# Patient Record
Sex: Male | Born: 1970 | Race: Black or African American | Hispanic: No | Marital: Married | Smoking: Former smoker
Health system: Southern US, Community
[De-identification: ages and names within clinical notes are randomized; demographics above are authoritative.]

## PROBLEM LIST (undated history)

## (undated) DIAGNOSIS — W3400XA Accidental discharge from unspecified firearms or gun, initial encounter: Secondary | ICD-10-CM

## (undated) DIAGNOSIS — K219 Gastro-esophageal reflux disease without esophagitis: Secondary | ICD-10-CM

## (undated) DIAGNOSIS — J45909 Unspecified asthma, uncomplicated: Secondary | ICD-10-CM

## (undated) HISTORY — PX: NEPHRECTOMY: SHX65

---

## 2013-04-09 ENCOUNTER — Emergency Department (HOSPITAL_COMMUNITY): Payer: Medicaid Other

## 2013-04-09 ENCOUNTER — Encounter (HOSPITAL_COMMUNITY): Payer: Self-pay | Admitting: *Deleted

## 2013-04-09 ENCOUNTER — Emergency Department (HOSPITAL_COMMUNITY)
Admission: EM | Admit: 2013-04-09 | Discharge: 2013-04-09 | Disposition: A | Discharge status: Home / Self Care | Payer: Medicaid Other | Attending: Emergency Medicine | Admitting: Emergency Medicine

## 2013-04-09 DIAGNOSIS — Z87891 Personal history of nicotine dependence: Secondary | ICD-10-CM | POA: Insufficient documentation

## 2013-04-09 DIAGNOSIS — N390 Urinary tract infection, site not specified: Secondary | ICD-10-CM | POA: Insufficient documentation

## 2013-04-09 DIAGNOSIS — R109 Unspecified abdominal pain: Secondary | ICD-10-CM | POA: Insufficient documentation

## 2013-04-09 DIAGNOSIS — R05 Cough: Secondary | ICD-10-CM | POA: Insufficient documentation

## 2013-04-09 DIAGNOSIS — Z79899 Other long term (current) drug therapy: Secondary | ICD-10-CM | POA: Insufficient documentation

## 2013-04-09 DIAGNOSIS — R1310 Dysphagia, unspecified: Secondary | ICD-10-CM | POA: Insufficient documentation

## 2013-04-09 DIAGNOSIS — M542 Cervicalgia: Secondary | ICD-10-CM | POA: Insufficient documentation

## 2013-04-09 DIAGNOSIS — R112 Nausea with vomiting, unspecified: Secondary | ICD-10-CM | POA: Insufficient documentation

## 2013-04-09 DIAGNOSIS — R51 Headache: Secondary | ICD-10-CM | POA: Insufficient documentation

## 2013-04-09 DIAGNOSIS — J3489 Other specified disorders of nose and nasal sinuses: Secondary | ICD-10-CM | POA: Insufficient documentation

## 2013-04-09 DIAGNOSIS — R6883 Chills (without fever): Secondary | ICD-10-CM | POA: Insufficient documentation

## 2013-04-09 DIAGNOSIS — J029 Acute pharyngitis, unspecified: Secondary | ICD-10-CM | POA: Insufficient documentation

## 2013-04-09 DIAGNOSIS — J36 Peritonsillar abscess: Secondary | ICD-10-CM | POA: Insufficient documentation

## 2013-04-09 DIAGNOSIS — Z87828 Personal history of other (healed) physical injury and trauma: Secondary | ICD-10-CM | POA: Insufficient documentation

## 2013-04-09 DIAGNOSIS — R059 Cough, unspecified: Secondary | ICD-10-CM | POA: Insufficient documentation

## 2013-04-09 DIAGNOSIS — K219 Gastro-esophageal reflux disease without esophagitis: Secondary | ICD-10-CM | POA: Insufficient documentation

## 2013-04-09 HISTORY — DX: Gastro-esophageal reflux disease without esophagitis: K21.9

## 2013-04-09 HISTORY — DX: Accidental discharge from unspecified firearms or gun, initial encounter: W34.00XA

## 2013-04-09 LAB — DIFFERENTIAL
Basophils Absolute: 0 10*3/uL (ref 0.0–0.1)
Eosinophils Absolute: 0.1 10*3/uL (ref 0.0–0.7)
Eosinophils Relative: 0 % (ref 0–5)
Lymphocytes Relative: 11 % — ABNORMAL LOW (ref 12–46)
Monocytes Absolute: 2.1 10*3/uL — ABNORMAL HIGH (ref 0.1–1.0)

## 2013-04-09 LAB — CBC
HCT: 39 % (ref 39.0–52.0)
MCHC: 36.7 g/dL — ABNORMAL HIGH (ref 30.0–36.0)
RDW: 13.7 % (ref 11.5–15.5)
WBC: 18.9 10*3/uL — ABNORMAL HIGH (ref 4.0–10.5)

## 2013-04-09 LAB — URINALYSIS, ROUTINE W REFLEX MICROSCOPIC
Nitrite: NEGATIVE
Protein, ur: 30 mg/dL — AB
Specific Gravity, Urine: 1.025 (ref 1.005–1.030)
Urobilinogen, UA: 0.2 mg/dL (ref 0.0–1.0)

## 2013-04-09 LAB — BASIC METABOLIC PANEL
BUN: 19 mg/dL (ref 6–23)
Chloride: 100 mEq/L (ref 96–112)
GFR calc Af Amer: >90 mL/min (ref >90)
GFR calc non Af Amer: 80 mL/min — ABNORMAL LOW (ref >90)
Potassium: 3.9 mEq/L (ref 3.5–5.1)
Sodium: 139 mEq/L (ref 135–145)

## 2013-04-09 LAB — POCT I-STAT TROPONIN I: Troponin i, poc: 0 ng/mL (ref 0.00–0.08)

## 2013-04-09 LAB — URINE MICROSCOPIC-ADD ON

## 2013-04-09 LAB — PRO B NATRIURETIC PEPTIDE: Pro B Natriuretic peptide (BNP): 15 pg/mL (ref 0–125)

## 2013-04-09 MED ORDER — SODIUM CHLORIDE 0.9 % IV BOLUS (SEPSIS)
2000.0000 mL | Freq: Once | INTRAVENOUS | Status: AC
  Administered 2013-04-09: 2000 mL via INTRAVENOUS

## 2013-04-09 MED ORDER — CEPHALEXIN 500 MG PO CAPS: 500.0000 mg | ORAL_CAPSULE | Freq: Two times a day (BID) | ORAL | Status: DC

## 2013-04-09 MED ORDER — DEXAMETHASONE SODIUM PHOSPHATE 10 MG/ML IJ SOLN
20.0000 mg | Freq: Once | INTRAMUSCULAR | Status: AC
  Administered 2013-04-09: 20 mg via INTRAVENOUS
  Filled 2013-04-09: qty 1

## 2013-04-09 MED ORDER — DEXTROSE 5 % IV SOLN
1.0000 g | Freq: Once | INTRAVENOUS | Status: AC
  Administered 2013-04-09: 1 g via INTRAVENOUS
  Filled 2013-04-09: qty 10

## 2013-04-09 MED ORDER — CLINDAMYCIN PHOSPHATE 600 MG/50ML IV SOLN
600.0000 mg | Freq: Once | INTRAVENOUS | Status: DC
  Administered 2013-04-09: 600 mg via INTRAVENOUS
  Filled 2013-04-09: qty 50

## 2013-04-09 MED ORDER — DEXAMETHASONE SODIUM PHOSPHATE 10 MG/ML IJ SOLN
INTRAMUSCULAR | Status: AC
  Administered 2013-04-09: 10 mg
  Filled 2013-04-09: qty 1

## 2013-04-09 MED ORDER — IBUPROFEN 800 MG PO TABS: 800.0000 mg | ORAL_TABLET | Freq: Three times a day (TID) | ORAL | Status: DC

## 2013-04-09 MED ORDER — CLINDAMYCIN PHOSPHATE 900 MG/50ML IV SOLN
900.0000 mg | Freq: Once | INTRAVENOUS | Status: AC
  Administered 2013-04-09: 900 mg via INTRAVENOUS
  Filled 2013-04-09: qty 50

## 2013-04-09 MED ORDER — ACETAMINOPHEN 325 MG PO TABS
650.0000 mg | ORAL_TABLET | Freq: Once | ORAL | Status: AC
  Administered 2013-04-09: 650 mg via ORAL
  Filled 2013-04-09: qty 2

## 2013-04-09 MED ORDER — HYDROCODONE-ACETAMINOPHEN 5-325 MG PO TABS
1.0000 | ORAL_TABLET | Freq: Four times a day (QID) | ORAL | Status: DC | PRN
Start: 2013-04-09 — End: 2013-07-05

## 2013-04-09 MED ORDER — CLINDAMYCIN HCL 150 MG PO CAPS: 300.0000 mg | ORAL_CAPSULE | Freq: Four times a day (QID) | ORAL | Status: DC

## 2013-04-09 NOTE — ED Notes (Signed)
Pt has fever 100.2f. Tylenol given. Pt temp will be checked in prior to discharge.

## 2013-04-09 NOTE — ED Notes (Signed)
Pt reports having mid chest pain since yesterday, having productive cough with yellow sputum. States that cp increases with cough and having mild sob. Airway intact, no resp distress noted. Pt reports having foul smelling urine also.

## 2013-04-09 NOTE — ED Notes (Signed)
Lab called to add differential to previously collected blood specimen.
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
## 2013-04-10 NOTE — ED Provider Notes (Signed)
Medical screening examination/treatment/procedure(s) were performed by non-physician practitioner and as supervising physician I was immediately available for consultation/collaboration.   Lailyn Appelbaum L Mareon Robinette, MD 04/10/13 2330 

## 2013-04-15 LAB — URINE CULTURE: Colony Count: >=100000

## 2013-07-05 ENCOUNTER — Emergency Department (HOSPITAL_COMMUNITY)
Admission: EM | Admit: 2013-07-05 | Discharge: 2013-07-05 | Disposition: A | Discharge status: Home / Self Care | Payer: Medicaid Other | Attending: Emergency Medicine | Admitting: Emergency Medicine

## 2013-07-05 ENCOUNTER — Encounter (HOSPITAL_COMMUNITY): Payer: Self-pay | Admitting: Emergency Medicine

## 2013-07-05 DIAGNOSIS — R112 Nausea with vomiting, unspecified: Secondary | ICD-10-CM | POA: Insufficient documentation

## 2013-07-05 DIAGNOSIS — R509 Fever, unspecified: Secondary | ICD-10-CM | POA: Insufficient documentation

## 2013-07-05 DIAGNOSIS — J029 Acute pharyngitis, unspecified: Secondary | ICD-10-CM

## 2013-07-05 DIAGNOSIS — Z8719 Personal history of other diseases of the digestive system: Secondary | ICD-10-CM | POA: Insufficient documentation

## 2013-07-05 DIAGNOSIS — J45909 Unspecified asthma, uncomplicated: Secondary | ICD-10-CM | POA: Insufficient documentation

## 2013-07-05 DIAGNOSIS — Z87828 Personal history of other (healed) physical injury and trauma: Secondary | ICD-10-CM | POA: Insufficient documentation

## 2013-07-05 HISTORY — DX: Unspecified asthma, uncomplicated: J45.909

## 2013-07-05 LAB — RAPID STREP SCREEN (MED CTR MEBANE ONLY): Streptococcus, Group A Screen (Direct): NEGATIVE

## 2013-07-05 MED ORDER — ONDANSETRON HCL 4 MG PO TABS: 4.0000 mg | ORAL_TABLET | Freq: Four times a day (QID) | ORAL | Status: DC

## 2013-07-05 MED ORDER — IBUPROFEN 800 MG PO TABS
800.0000 mg | ORAL_TABLET | Freq: Once | ORAL | Status: AC
  Administered 2013-07-05: 800 mg via ORAL
  Filled 2013-07-05: qty 1

## 2013-07-05 MED ORDER — ONDANSETRON 4 MG PO TBDP
8.0000 mg | ORAL_TABLET | Freq: Once | ORAL | Status: AC
  Administered 2013-07-05: 8 mg via ORAL
  Filled 2013-07-05: qty 2
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
## 2013-07-05 NOTE — ED Notes (Signed)
Pt family: [phone] wanted to be called if pt would be admitted and would like to know the room number.

## 2013-07-05 NOTE — ED Notes (Addendum)
Pt reports left side tonsil swelling noted yesterday. Pt reports in the past had swelling to the right tonsil area that was suppose to be drained. Pt reports fever but did not check his temperature at home. Pt unable to swallow. Pt vomiting in triage, reports started yesterday.

## 2013-07-07 LAB — CULTURE, GROUP A STREP

## 2013-07-07 NOTE — ED Provider Notes (Signed)
Medical screening examination/treatment/procedure(s) were performed by non-physician practitioner and as supervising physician I was immediately available for consultation/collaboration.   Gavin Pound. Vernis Eid, MD 07/07/13 2317

## 2015-11-01 ENCOUNTER — Emergency Department (HOSPITAL_COMMUNITY): Payer: Medicaid Other

## 2015-11-01 ENCOUNTER — Encounter (HOSPITAL_COMMUNITY): Payer: Self-pay

## 2015-11-01 ENCOUNTER — Emergency Department (HOSPITAL_COMMUNITY)
Admission: EM | Admit: 2015-11-01 | Discharge: 2015-11-01 | Disposition: A | Discharge status: Home / Self Care | Payer: Medicaid Other | Attending: Emergency Medicine | Admitting: Emergency Medicine

## 2015-11-01 DIAGNOSIS — Z87828 Personal history of other (healed) physical injury and trauma: Secondary | ICD-10-CM | POA: Insufficient documentation

## 2015-11-01 DIAGNOSIS — K219 Gastro-esophageal reflux disease without esophagitis: Secondary | ICD-10-CM | POA: Insufficient documentation

## 2015-11-01 DIAGNOSIS — Z79899 Other long term (current) drug therapy: Secondary | ICD-10-CM | POA: Insufficient documentation

## 2015-11-01 DIAGNOSIS — J45909 Unspecified asthma, uncomplicated: Secondary | ICD-10-CM | POA: Insufficient documentation

## 2015-11-01 DIAGNOSIS — Z87891 Personal history of nicotine dependence: Secondary | ICD-10-CM | POA: Diagnosis not present

## 2015-11-01 DIAGNOSIS — R079 Chest pain, unspecified: Secondary | ICD-10-CM | POA: Diagnosis present

## 2015-11-01 DIAGNOSIS — R0789 Other chest pain: Secondary | ICD-10-CM | POA: Insufficient documentation

## 2015-11-01 LAB — I-STAT TROPONIN, ED: TROPONIN I, POC: 0 ng/mL (ref 0.00–0.08)

## 2015-11-01 LAB — BASIC METABOLIC PANEL
ANION GAP: 7 (ref 5–15)
BUN: 11 mg/dL (ref 6–20)
CALCIUM: 9.1 mg/dL (ref 8.9–10.3)
CO2: 28 mmol/L (ref 22–32)
CREATININE: 1.12 mg/dL (ref 0.61–1.24)
Chloride: 105 mmol/L (ref 101–111)
Glucose, Bld: 98 mg/dL (ref 65–99)
Potassium: 3.8 mmol/L (ref 3.5–5.1)
Sodium: 140 mmol/L (ref 135–145)

## 2015-11-01 LAB — CBC
HCT: 34.8 % — ABNORMAL LOW (ref 39.0–52.0)
HEMOGLOBIN: 11.7 g/dL — AB (ref 13.0–17.0)
MCH: 30.6 pg (ref 26.0–34.0)
MCHC: 33.6 g/dL (ref 30.0–36.0)
MCV: 91.1 fL (ref 78.0–100.0)
PLATELETS: 90 10*3/uL — AB (ref 150–400)
RBC: 3.82 MIL/uL — AB (ref 4.22–5.81)
RDW: 14.5 % (ref 11.5–15.5)
WBC: 7.4 10*3/uL (ref 4.0–10.5)

## 2015-11-01 MED ORDER — KETOROLAC TROMETHAMINE 60 MG/2ML IM SOLN
60.0000 mg | Freq: Once | INTRAMUSCULAR | Status: AC
  Administered 2015-11-01: 60 mg via INTRAMUSCULAR
  Filled 2015-11-01: qty 2

## 2015-11-01 MED ORDER — NAPROXEN 500 MG PO TABS: 500.0000 mg | ORAL_TABLET | Freq: Two times a day (BID) | ORAL | Status: DC

## 2015-11-01 NOTE — ED Notes (Signed)
Pt states that he started having chest pressure Thursday afternoon, no radiation, denies other cardiac symptoms. 8/10 pain

## 2015-11-01 NOTE — Discharge Instructions (Signed)
You were seen today for chest pain. Your workup is reassuring. You may have a muscle or chest wall strain. You are relatively low risk for heart disease. If you have continued pain, you should follow-up with cardiology. If you have any new or worsening symptoms you should be reevaluated immediately.  Chest Wall Pain Chest wall pain is pain in or around the bones and muscles of your chest. Sometimes, an injury causes this pain. Sometimes, the cause may not be known. This pain may take several weeks or longer to get better. HOME CARE INSTRUCTIONS  Pay attention to any changes in your symptoms. Take these actions to help with your pain:   Rest as told by your health care provider.   Avoid activities that cause pain. These include any activities that use your chest muscles or your abdominal and side muscles to lift heavy items.   If directed, apply ice to the painful area:  Put ice in a plastic bag.  Place a towel between your skin and the bag.  Leave the ice on for 20 minutes, 2-3 times per day.  Take over-the-counter and prescription medicines only as told by your health care provider.  Do not use tobacco products, including cigarettes, chewing tobacco, and e-cigarettes. If you need help quitting, ask your health care provider.  Keep all follow-up visits as told by your health care provider. This is important. SEEK MEDICAL CARE IF:  You have a fever.  Your chest pain becomes worse.  You have new symptoms. SEEK IMMEDIATE MEDICAL CARE IF:  You have nausea or vomiting.  You feel sweaty or light-headed.  You have a cough with phlegm (sputum) or you cough up blood.  You develop shortness of breath.   This information is not intended to replace advice given to you by your health care provider. Make sure you discuss any questions you have with your health care provider.   Document Released: 11/27/2005 Document Revised: 08/18/2015 Document Reviewed: 02/22/2015 Elsevier  Interactive Patient Education [employer]2016 [employer]
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
## 2015-11-01 NOTE — ED Notes (Signed)
Family at bedside. 

## 2017-05-11 ENCOUNTER — Encounter (HOSPITAL_COMMUNITY): Payer: Self-pay | Admitting: Family Medicine

## 2017-05-11 ENCOUNTER — Ambulatory Visit (HOSPITAL_COMMUNITY)
Admission: EM | Admit: 2017-05-11 | Discharge: 2017-05-11 | Disposition: A | Discharge status: Home / Self Care | Payer: Medicaid Other | Attending: Internal Medicine | Admitting: Internal Medicine

## 2017-05-11 DIAGNOSIS — R369 Urethral discharge, unspecified: Secondary | ICD-10-CM | POA: Insufficient documentation

## 2017-05-11 DIAGNOSIS — N342 Other urethritis: Secondary | ICD-10-CM

## 2017-05-11 DIAGNOSIS — R1030 Lower abdominal pain, unspecified: Secondary | ICD-10-CM

## 2017-05-11 DIAGNOSIS — K219 Gastro-esophageal reflux disease without esophagitis: Secondary | ICD-10-CM

## 2017-05-11 DIAGNOSIS — N3090 Cystitis, unspecified without hematuria: Secondary | ICD-10-CM | POA: Diagnosis not present

## 2017-05-11 DIAGNOSIS — R112 Nausea with vomiting, unspecified: Secondary | ICD-10-CM

## 2017-05-11 LAB — POCT URINALYSIS DIP (DEVICE)
Glucose, UA: NEGATIVE mg/dL
Ketones, ur: 15 mg/dL — AB
NITRITE: NEGATIVE
Protein, ur: >=300 mg/dL — AB
Specific Gravity, Urine: 1.025 (ref 1.005–1.030)
UROBILINOGEN UA: 0.2 mg/dL (ref 0.0–1.0)
pH: 6 (ref 5.0–8.0)

## 2017-05-11 MED ORDER — CEFTRIAXONE SODIUM 1 G IJ SOLR
INTRAMUSCULAR | Status: AC
  Filled 2017-05-11: qty 10

## 2017-05-11 MED ORDER — AZITHROMYCIN 250 MG PO TABS: ORAL_TABLET | ORAL | 0 refills | Status: AC

## 2017-05-11 MED ORDER — ONDANSETRON HCL 4 MG PO TABS
4.0000 mg | ORAL_TABLET | Freq: Four times a day (QID) | ORAL | 0 refills | Status: AC
Start: 2017-05-11 — End: ?

## 2017-05-11 MED ORDER — LIDOCAINE HCL (PF) 1 % IJ SOLN
INTRAMUSCULAR | Status: AC
  Filled 2017-05-11: qty 2

## 2017-05-11 MED ORDER — CEPHALEXIN 500 MG PO CAPS: 500.0000 mg | ORAL_CAPSULE | Freq: Four times a day (QID) | ORAL | 0 refills | Status: AC

## 2017-05-11 MED ORDER — CEFTRIAXONE SODIUM 1 G IJ SOLR
1.0000 g | Freq: Once | INTRAMUSCULAR | Status: AC
  Administered 2017-05-11: 1 g via INTRAMUSCULAR

## 2017-05-11 NOTE — ED Triage Notes (Signed)
Pt here for lower abd pain and penile discharge.

## 2017-05-11 NOTE — Discharge Instructions (Signed)
Take the Zofran at home. In about 30 minutes take the omeprazole. And another 45 minutes start taking your antibiotics. Take small sips of liquid at a time. Do not eat any large meals, fatty or greasy foods. No fast foods. Take Zantac 150 mg 2 hours before bedtime. Cultures of urine have been obtained and he will be called regarding results especially if any changes need to be made. If you are getting worse, had new symptoms or problems may return, follow up with her primary care doctor or go to emergency department.
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
## 2017-05-12 LAB — URINE CULTURE: Culture: >=100000 — AB

## 2017-05-14 LAB — URINE CYTOLOGY ANCILLARY ONLY
CHLAMYDIA, DNA PROBE: NEGATIVE
NEISSERIA GONORRHEA: NEGATIVE
Trichomonas: NEGATIVE

## 2017-10-19 ENCOUNTER — Encounter (HOSPITAL_COMMUNITY): Payer: Self-pay

## 2017-10-19 ENCOUNTER — Emergency Department (HOSPITAL_COMMUNITY): Payer: Medicaid Other

## 2017-10-19 ENCOUNTER — Other Ambulatory Visit: Payer: Self-pay

## 2017-10-19 ENCOUNTER — Emergency Department (HOSPITAL_COMMUNITY)
Admission: EM | Admit: 2017-10-19 | Discharge: 2017-10-19 | Disposition: A | Discharge status: Home / Self Care | Payer: Medicaid Other | Attending: Emergency Medicine | Admitting: Emergency Medicine

## 2017-10-19 DIAGNOSIS — R070 Pain in throat: Secondary | ICD-10-CM | POA: Diagnosis present

## 2017-10-19 DIAGNOSIS — J45909 Unspecified asthma, uncomplicated: Secondary | ICD-10-CM | POA: Diagnosis not present

## 2017-10-19 DIAGNOSIS — Z79899 Other long term (current) drug therapy: Secondary | ICD-10-CM | POA: Insufficient documentation

## 2017-10-19 DIAGNOSIS — J36 Peritonsillar abscess: Secondary | ICD-10-CM

## 2017-10-19 DIAGNOSIS — R1110 Vomiting, unspecified: Secondary | ICD-10-CM | POA: Diagnosis not present

## 2017-10-19 DIAGNOSIS — M542 Cervicalgia: Secondary | ICD-10-CM | POA: Insufficient documentation

## 2017-10-19 DIAGNOSIS — H9203 Otalgia, bilateral: Secondary | ICD-10-CM | POA: Insufficient documentation

## 2017-10-19 DIAGNOSIS — Z87891 Personal history of nicotine dependence: Secondary | ICD-10-CM | POA: Insufficient documentation

## 2017-10-19 LAB — CBC WITH DIFFERENTIAL/PLATELET
BASOS ABS: 0 10*3/uL (ref 0.0–0.1)
Basophils Relative: 0 %
Eosinophils Absolute: 0 10*3/uL (ref 0.0–0.7)
Eosinophils Relative: 0 %
HEMATOCRIT: 39 % (ref 39.0–52.0)
HEMOGLOBIN: 13.5 g/dL (ref 13.0–17.0)
LYMPHS PCT: 8 %
Lymphs Abs: 1.4 10*3/uL (ref 0.7–4.0)
MCH: 31.4 pg (ref 26.0–34.0)
MCHC: 34.6 g/dL (ref 30.0–36.0)
MCV: 90.7 fL (ref 78.0–100.0)
Monocytes Absolute: 0.9 10*3/uL (ref 0.1–1.0)
Monocytes Relative: 5 %
NEUTROS ABS: 14.7 10*3/uL — AB (ref 1.7–7.7)
NEUTROS PCT: 87 %
PLATELETS: 149 10*3/uL — AB (ref 150–400)
RBC: 4.3 MIL/uL (ref 4.22–5.81)
RDW: 14 % (ref 11.5–15.5)
WBC: 17 10*3/uL — AB (ref 4.0–10.5)

## 2017-10-19 LAB — BASIC METABOLIC PANEL
ANION GAP: 12 (ref 5–15)
BUN: 28 mg/dL — ABNORMAL HIGH (ref 6–20)
CHLORIDE: 103 mmol/L (ref 101–111)
CO2: 23 mmol/L (ref 22–32)
Calcium: 9.5 mg/dL (ref 8.9–10.3)
Creatinine, Ser: 1.22 mg/dL (ref 0.61–1.24)
GFR calc Af Amer: >60 mL/min (ref >60)
Glucose, Bld: 95 mg/dL (ref 65–99)
POTASSIUM: 3.8 mmol/L (ref 3.5–5.1)
SODIUM: 138 mmol/L (ref 135–145)

## 2017-10-19 LAB — RAPID STREP SCREEN (MED CTR MEBANE ONLY): Streptococcus, Group A Screen (Direct): NEGATIVE

## 2017-10-19 MED ORDER — ONDANSETRON HCL 4 MG/2ML IJ SOLN
4.0000 mg | Freq: Once | INTRAMUSCULAR | Status: AC
  Administered 2017-10-19: 4 mg via INTRAVENOUS
  Filled 2017-10-19: qty 2

## 2017-10-19 MED ORDER — DEXAMETHASONE SODIUM PHOSPHATE 10 MG/ML IJ SOLN
10.0000 mg | Freq: Once | INTRAMUSCULAR | Status: AC
  Administered 2017-10-19: 10 mg via INTRAVENOUS
  Filled 2017-10-19: qty 1

## 2017-10-19 MED ORDER — IOPAMIDOL (ISOVUE-300) INJECTION 61%
INTRAVENOUS | Status: AC
  Administered 2017-10-19: 75 mL
  Filled 2017-10-19: qty 75

## 2017-10-19 MED ORDER — CLINDAMYCIN HCL 300 MG PO CAPS: 300.0000 mg | ORAL_CAPSULE | Freq: Four times a day (QID) | ORAL | 0 refills | Status: AC

## 2017-10-19 MED ORDER — CLINDAMYCIN PHOSPHATE 900 MG/50ML IV SOLN
900.0000 mg | Freq: Once | INTRAVENOUS | Status: AC
  Administered 2017-10-19: 900 mg via INTRAVENOUS
  Filled 2017-10-19: qty 50

## 2017-10-19 MED ORDER — KETOROLAC TROMETHAMINE 30 MG/ML IJ SOLN
30.0000 mg | Freq: Once | INTRAMUSCULAR | Status: AC
  Administered 2017-10-19: 30 mg via INTRAVENOUS
  Filled 2017-10-19: qty 1

## 2017-10-19 NOTE — ED Triage Notes (Signed)
Per Pt, Pt is coming from home with complaints of sore throat x 3 days. Reports some chills. UPon assessment, pt has swollen lymph nodes and complains of bilateral ear pain.

## 2017-10-19 NOTE — Discharge Instructions (Signed)
You can take Tylenol or Ibuprofen as directed for pain. You can alternate Tylenol and Ibuprofen every 4 hours. If you take Tylenol at 1pm, then you can take Ibuprofen at 5pm. Then you can take Tylenol again at 9pm.   Take antibiotics as directed. Please take all of your antibiotics until finished.  Follow-up with referred ear nose and throat doctor next week.  Call his office on Monday to arrange for an appointment.  Return the emergency department for any worsening symptoms, worsening pain, fever despite medications, difficulty breathing, inability to swallow your saliva, inability to swallow water, persistent vomiting or any other worsening or concerning symptoms.

## 2017-10-19 NOTE — ED Notes (Signed)
Pt able to speak clearly, reports relief from pain, rates 5/10.
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
## 2017-10-19 NOTE — ED Provider Notes (Signed)
MSE was initiated and I personally evaluated the patient and placed orders (if any) at  5:00 PM on October 19, 2017.  The patient appears stable so that the remainder of the MSE may be completed by another provider.  Care was assumed from Gwendalyn EgeLindsay Layden, PA-C at 5:06 PM.  Briefly, patient presented to the emergency department with a sore throat and CT head and neck revealed a right peritonsillar abscess.  Per discussion with Dr. Christain Sacramentoeo and ear nose and throat, patient is to receive 900 mg of clindamycin and 20 mg of Decadron and be reevaluated.  Patient will receive outpatient therapy and follow-up next week with Dr. Ladona Ridgelaylor as long as he can handle secretions at the end of reassessment.  Patient has been nontoxic-appearing and intermittently handing secretions in the emergency department.  Patient was independently evaluated by Dr. Pricilla LovelessScott Goldston.  Patient was discharged by RN after being able to tolerate a glass of water. Patient d/c to home per discussion with L. Layden, PA-C.      Calvin Warner, Calvin Bost B, PA-C 10/20/17 0154    Calvin Warner, Anthony, MD 10/22/17 [phone]

## 2017-10-19 NOTE — ED Notes (Signed)
Pt given water to drink. 

## 2017-10-21 LAB — CULTURE, GROUP A STREP (THRC)

## 2018-11-19 ENCOUNTER — Ambulatory Visit
Admission: RE | Admit: 2018-11-19 | Discharge: 2018-11-19 | Disposition: A | Discharge status: Home / Self Care | Payer: Medicaid Other | Source: Ambulatory Visit | Attending: Family Medicine | Admitting: Family Medicine

## 2018-11-19 ENCOUNTER — Other Ambulatory Visit: Payer: Self-pay | Admitting: Family Medicine

## 2018-11-19 DIAGNOSIS — M542 Cervicalgia: Secondary | ICD-10-CM

## 2019-02-25 ENCOUNTER — Encounter: Payer: Self-pay | Admitting: Gastroenterology
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
## 2024-06-12 ENCOUNTER — Other Ambulatory Visit: Payer: Self-pay

## 2024-06-12 ENCOUNTER — Emergency Department (HOSPITAL_COMMUNITY)
Admission: EM | Admit: 2024-06-12 | Discharge: 2024-06-12 | Disposition: A | Discharge status: Home / Self Care | Attending: Emergency Medicine | Admitting: Emergency Medicine

## 2024-06-12 ENCOUNTER — Encounter (HOSPITAL_COMMUNITY): Payer: Self-pay | Admitting: *Deleted

## 2024-06-12 DIAGNOSIS — K5900 Constipation, unspecified: Secondary | ICD-10-CM | POA: Insufficient documentation

## 2024-06-12 DIAGNOSIS — N390 Urinary tract infection, site not specified: Secondary | ICD-10-CM | POA: Insufficient documentation

## 2024-06-12 DIAGNOSIS — K6289 Other specified diseases of anus and rectum: Secondary | ICD-10-CM | POA: Diagnosis present

## 2024-06-12 DIAGNOSIS — K644 Residual hemorrhoidal skin tags: Secondary | ICD-10-CM | POA: Diagnosis not present

## 2024-06-12 LAB — URINALYSIS, ROUTINE W REFLEX MICROSCOPIC
Bilirubin Urine: NEGATIVE
Glucose, UA: NEGATIVE mg/dL
Ketones, ur: NEGATIVE mg/dL
Nitrite: NEGATIVE
Protein, ur: NEGATIVE mg/dL
Specific Gravity, Urine: 1.012 (ref 1.005–1.030)
WBC, UA: >50 WBC/hpf (ref 0–5)
pH: 6 (ref 5.0–8.0)

## 2024-06-12 LAB — BASIC METABOLIC PANEL WITH GFR
Anion gap: 9 (ref 5–15)
BUN: 15 mg/dL (ref 6–20)
CO2: 24 mmol/L (ref 22–32)
Calcium: 9 mg/dL (ref 8.9–10.3)
Chloride: 101 mmol/L (ref 98–111)
Creatinine, Ser: 1.18 mg/dL (ref 0.61–1.24)
GFR, Estimated: >60 mL/min (ref >60)
Glucose, Bld: 110 mg/dL — ABNORMAL HIGH (ref 70–99)
Potassium: 4.4 mmol/L (ref 3.5–5.1)
Sodium: 134 mmol/L — ABNORMAL LOW (ref 135–145)

## 2024-06-12 LAB — CBC
HCT: 40.4 % (ref 39.0–52.0)
Hemoglobin: 12.9 g/dL — ABNORMAL LOW (ref 13.0–17.0)
MCH: 29.9 pg (ref 26.0–34.0)
MCHC: 31.9 g/dL (ref 30.0–36.0)
MCV: 93.7 fL (ref 80.0–100.0)
Platelets: 213 10*3/uL (ref 150–400)
RBC: 4.31 MIL/uL (ref 4.22–5.81)
RDW: 14.7 % (ref 11.5–15.5)
WBC: 6.6 10*3/uL (ref 4.0–10.5)
nRBC: 0 % (ref 0.0–0.2)

## 2024-06-12 MED ORDER — CIPROFLOXACIN HCL 500 MG PO TABS: 500.0000 mg | ORAL_TABLET | Freq: Two times a day (BID) | ORAL | 0 refills | Status: AC

## 2024-06-12 MED ORDER — CVS EPSOM SALT GRAN: 1.0000 | GRANULES | Freq: Every day | 0 refills | Status: AC | PRN

## 2024-06-12 MED ORDER — PHENYLEPHRINE-MINERAL OIL-PET 0.25-14-74.9 % RE OINT: 1.0000 | TOPICAL_OINTMENT | Freq: Two times a day (BID) | RECTAL | 0 refills | Status: AC | PRN

## 2024-06-12 MED ORDER — POLYETHYLENE GLYCOL 3350 17 GM/SCOOP PO POWD: 17.0000 g | Freq: Every day | ORAL | 0 refills | Status: AC | PRN

## 2024-06-12 MED ORDER — CIPROFLOXACIN HCL 500 MG PO TABS
500.0000 mg | ORAL_TABLET | Freq: Once | ORAL | Status: AC
  Administered 2024-06-12: 500 mg via ORAL
  Filled 2024-06-12: qty 1

## 2024-06-12 NOTE — ED Notes (Signed)
 Patient Alert and oriented to baseline. Stable and ambulatory to baseline. Patient verbalized understanding of the discharge instructions.  Patient belongings were taken by the patient.

## 2024-06-12 NOTE — ED Triage Notes (Signed)
 Pt reports rectal pain x 2 weeks.  Pt can not answer if he has hemorrhoids but states that he has a bump that is pushing out and has had constipation, LBM 2 days ago and stool was hard. Pt denies any rectal bleeding. No abdominal pain.

## 2024-06-12 NOTE — Discharge Instructions (Addendum)
 You were seen in the emerged from for rectal pain You have a hemorrhoid over the rectum that does not appear to be bleeding actively For this reason we have called in a prescription for Preparation H and Epsom salt Use these as indicated for treatment of your hemorrhoid We have also called in a prescription for MiraLAX to help with constipation Your urine test was positive for UTI.  We have sent it for culture to see what bacteria is causing the infection.  We have called in antibiotic for you to pick up from your [facility] and begin taking as directed to treat the UTI Follow-up with your primary care doctor for reevaluation within 1 week Return to the emergency department for severe pain, if you are unable to move your bowels despite MiraLAX after couple days or for any other concerns

## 2024-06-12 NOTE — ED Provider Notes (Signed)
 [place] EMERGENCY DEPARTMENT AT [facility] Provider Note   CSN: [id number] Arrival date & time: 06/12/24  0730     Patient presents with: Rectal Pain   Calvin Warner is a 53 y.o. male.  With history of status post nephrectomy and asthma who presents to the ED for rectal pain.  Patient has reported rectal pain over the last 2 weeks.  He notes recent constipation.  Feels a bump on his rectum.  No significant bleeding or bleeding on toilet tissue.  No prior history of hemorrhoids.  Denies abdominal pain nausea vomiting.  Last bowel movement was 2 days ago.  Wife voices concern for Foul-smelling urine.  No urinary symptoms.   HPI     Prior to Admission medications   Medication Sig Start Date End Date Taking? Authorizing Provider  ciprofloxacin (CIPRO) 500 MG tablet Take 1 tablet (500 mg total) by mouth every 12 (twelve) hours for 9 days. 06/12/24 06/21/24 Yes Pamella Ozell LABOR, DO  Magnesium Sulfate, Laxative, ([employer] EPSOM SALT) GRAN 1 each by Does not apply route daily as needed (forhemorrhoids). 06/12/24  Yes Pamella Ozell LABOR, DO  phenylephrine-shark liver oil-mineral oil-petrolatum (PREPARATION H) 0.25-14-74.9 % rectal ointment Place 1 Application rectally 2 (two) times daily as needed for hemorrhoids. 06/12/24  Yes Pamella Ozell LABOR, DO  polyethylene glycol powder (GLYCOLAX/MIRALAX) 17 GM/SCOOP powder Take 17 g by mouth daily as needed for moderate constipation or severe constipation. 06/12/24  Yes Pamella Ozell LABOR, DO  azithromycin  (ZITHROMAX ) 250 MG tablet Take all 4 tabs by po today 05/11/17   Tharon Lenis, NP  cephALEXin  (KEFLEX ) 500 MG capsule Take 1 capsule (500 mg total) by mouth 4 (four) times daily. 05/11/17   Tharon Lenis, NP  loratadine (CLARITIN) 10 MG tablet Take 10 mg by mouth daily.    [provider]  omeprazole (PRILOSEC) 20 MG capsule Take 20 mg by mouth daily.    [provider]  ondansetron  (ZOFRAN ) 4 MG tablet Take 1 tablet (4 mg total) by mouth every 6  (six) hours. Prn nausea and vomiting. 05/11/17   Tharon Lenis, NP    Allergies: Patient has no known allergies.    Review of Systems  Updated Vital Signs BP (!) 133/98   Pulse 71   Temp (!) 97.5 F (36.4 C)   Resp 17   SpO2 100%   Physical Exam Vitals and nursing note reviewed.  HENT:     Head: Normocephalic and atraumatic.  Eyes:     Pupils: Pupils are equal, round, and reactive to light.  Cardiovascular:     Rate and Rhythm: Normal rate and regular rhythm.  Pulmonary:     Effort: Pulmonary effort is normal.     Breath sounds: Normal breath sounds.  Abdominal:     Palpations: Abdomen is soft.     Tenderness: There is no abdominal tenderness.  Genitourinary:    Comments: External hemorrhoid at 12 o'clock position without significant thrombosis or bleeding Skin:    General: Skin is warm and dry.  Neurological:     Mental Status: He is alert.  Psychiatric:        Mood and Affect: Mood normal.     (all labs ordered are listed, but only abnormal results are displayed) Labs Reviewed  CBC - Abnormal; Notable for the following components:      Result Value   Hemoglobin 12.9 (*)    All other components within normal limits  BASIC METABOLIC PANEL WITH GFR - Abnormal;  Notable for the following components:   Sodium 134 (*)    Glucose, Bld 110 (*)    All other components within normal limits  URINALYSIS, ROUTINE W REFLEX MICROSCOPIC - Abnormal; Notable for the following components:   APPearance HAZY (*)    Hgb urine dipstick SMALL (*)    Leukocytes,Ua MODERATE (*)    Bacteria, UA MANY (*)    All other components within normal limits  URINE CULTURE    EKG: None  Radiology: No results found.   Procedures   Medications Ordered in the ED  ciprofloxacin (CIPRO) tablet 500 mg (has no administration in time range)    Clinical Course as of 06/12/24 1010  Thu Jun 12, 2024  1008 UA is positive.  Will give first dose of antibiotics here and discharged with course of  antibiotics instruct for PCP follow-up. [MP]    Clinical Course User Index [MP] Pamella Ozell LABOR, DO                                 Medical Decision Making 53 year old male with history as above presenting to the ED for hemorrhoid x 2 weeks.  No significant bleeding.  Benign physical exam with general hemorrhoid 12 o'clock position without severe thrombosis or active bleeding.  Secondary complaint of foul-smelling urine.  CBC CMP including renal function look okay.  Will send urine to look for evidence of UTI.  Will instruct her symptomatic management of external hemorrhoid at home with Preparation H Epsom salt baths and MiraLAX to help with constipation.  Will give antibiotics if urine is positive for UTI  Amount and/or Complexity of Data Reviewed Labs: ordered.  Risk OTC drugs. Prescription drug management.        Final diagnoses:  External hemorrhoid  Constipation, unspecified constipation type  Urinary tract infection with hematuria, site unspecified    ED Discharge Orders          Ordered    phenylephrine-shark liver oil-mineral oil-petrolatum (PREPARATION H) 0.25-14-74.9 % rectal ointment  2 times daily PRN        06/12/24 0905    Magnesium Sulfate, Laxative, ([employer] EPSOM SALT) GRAN  Daily PRN        06/12/24 0905    polyethylene glycol powder (GLYCOLAX/MIRALAX) 17 GM/SCOOP powder  Daily PRN        06/12/24 0919    ciprofloxacin (CIPRO) 500 MG tablet  Every 12 hours        06/12/24 1010               Pamella Ozell A, DO 06/12/24 1010

## 2024-06-14 LAB — URINE CULTURE: Culture: >=100000 — AB

## 2024-06-15 ENCOUNTER — Telehealth (HOSPITAL_BASED_OUTPATIENT_CLINIC_OR_DEPARTMENT_OTHER): Payer: Self-pay | Admitting: *Deleted

## 2024-06-15 NOTE — Telephone Encounter (Signed)
 Post ED Visit - Positive Culture Follow-up  Culture report reviewed by antimicrobial stewardship pharmacist: [facility] []  [place], Pharm.D. []  Venetia Gully, [address].D., BCPS AQ-ID []  Garrel Crews, Pharm.D., BCPS []  Almarie Lunger, Pharm.D., BCPS []  [place], [address].D., BCPS, AAHIVP []  Rosaline Bihari, Pharm.D., BCPS, AAHIVP []  Vernell Meier, PharmD, BCPS []  Latanya Hint, PharmD, BCPS []  Donald Medley, PharmD, BCPS []  Rocky Bold, PharmD []  Dorothyann Alert, PharmD, BCPS [x]  Dorn Buttner, PharmD  [facility] []  Rosaline Edison, PharmD []  Romona Bliss, PharmD []  Dolphus Roller, PharmD []  Veva Seip, Rph []  Vernell Daunt) Leonce, PharmD []  Eva Allis, PharmD []  Rosaline Millet, PharmD []  Iantha Batch, PharmD []  Arvin Gauss, PharmD []  Wanda Hasting, PharmD []  Ronal Rav, PharmD []  Rocky Slade, PharmD []  Bard Jeans, PharmD   Positive urine culture Treated with Ciprofloxacin  No action needed   Calvin Warner 06/15/2024, 1:19 PM
